# Patient Record
Sex: Male | Born: 1970 | Race: White | Hispanic: No | Marital: Married | Smoking: Never smoker
Health system: Southern US, Community
[De-identification: ages and names within clinical notes are randomized; demographics above are authoritative.]

---

## 2016-02-27 ENCOUNTER — Emergency Department: Payer: 59

## 2016-02-27 ENCOUNTER — Emergency Department
Admission: EM | Admit: 2016-02-27 | Discharge: 2016-02-27 | Disposition: A | Discharge status: Home / Self Care | Payer: 59 | Attending: Emergency Medicine | Admitting: Emergency Medicine

## 2016-02-27 DIAGNOSIS — N200 Calculus of kidney: Secondary | ICD-10-CM

## 2016-02-27 DIAGNOSIS — R109 Unspecified abdominal pain: Secondary | ICD-10-CM | POA: Diagnosis present

## 2016-02-27 LAB — COMPREHENSIVE METABOLIC PANEL
ALK PHOS: 55 U/L (ref 38–126)
ALT: 26 U/L (ref 17–63)
AST: 23 U/L (ref 15–41)
Albumin: 4.7 g/dL (ref 3.5–5.0)
Anion gap: 2 — ABNORMAL LOW (ref 5–15)
BUN: 18 mg/dL (ref 6–20)
CHLORIDE: 113 mmol/L — AB (ref 101–111)
CO2: 28 mmol/L (ref 22–32)
CREATININE: 0.98 mg/dL (ref 0.61–1.24)
Calcium: 8.7 mg/dL — ABNORMAL LOW (ref 8.9–10.3)
GFR calc Af Amer: >60 mL/min (ref >60)
Glucose, Bld: 111 mg/dL — ABNORMAL HIGH (ref 65–99)
Potassium: 3.7 mmol/L (ref 3.5–5.1)
Sodium: 143 mmol/L (ref 135–145)
Total Bilirubin: 0.3 mg/dL (ref 0.3–1.2)
Total Protein: 7 g/dL (ref 6.5–8.1)

## 2016-02-27 LAB — CBC
HEMATOCRIT: 40 % (ref 40.0–52.0)
HEMOGLOBIN: 13.9 g/dL (ref 13.0–18.0)
MCH: 30.1 pg (ref 26.0–34.0)
MCHC: 34.9 g/dL (ref 32.0–36.0)
MCV: 86.3 fL (ref 80.0–100.0)
PLATELETS: 277 10*3/uL (ref 150–440)
RBC: 4.63 MIL/uL (ref 4.40–5.90)
RDW: 13.4 % (ref 11.5–14.5)
WBC: 9.2 10*3/uL (ref 3.8–10.6)

## 2016-02-27 LAB — URINALYSIS COMPLETE WITH MICROSCOPIC (ARMC ONLY)
Bilirubin Urine: NEGATIVE
Glucose, UA: NEGATIVE mg/dL
Leukocytes, UA: NEGATIVE
Nitrite: NEGATIVE
PROTEIN: 100 mg/dL — AB
Specific Gravity, Urine: 1.028 (ref 1.005–1.030)
pH: 5 (ref 5.0–8.0)

## 2016-02-27 LAB — LIPASE, BLOOD: LIPASE: 19 U/L (ref 11–51)

## 2016-02-27 MED ORDER — TAMSULOSIN HCL 0.4 MG PO CAPS: 0.4000 mg | ORAL_CAPSULE | Freq: Every day | ORAL | Status: AC

## 2016-02-27 MED ORDER — KETOROLAC TROMETHAMINE 30 MG/ML IJ SOLN
30.0000 mg | Freq: Once | INTRAMUSCULAR | Status: AC
  Administered 2016-02-27: 30 mg via INTRAVENOUS
  Filled 2016-02-27: qty 1

## 2016-02-27 MED ORDER — HYDROCODONE-ACETAMINOPHEN 5-325 MG PO TABS: 1.0000 | ORAL_TABLET | ORAL | Status: AC | PRN

## 2016-02-27 MED ORDER — SODIUM CHLORIDE 0.9 % IV SOLN
1000.0000 mL | Freq: Once | INTRAVENOUS | Status: AC
  Administered 2016-02-27: 1000 mL via INTRAVENOUS

## 2016-02-27 MED ORDER — NAPROXEN 500 MG PO TABS: 500.0000 mg | ORAL_TABLET | Freq: Two times a day (BID) | ORAL | Status: AC

## 2016-02-27 MED ORDER — ONDANSETRON HCL 4 MG PO TABS: 4.0000 mg | ORAL_TABLET | Freq: Every day | ORAL | Status: AC | PRN

## 2016-02-27 NOTE — ED Notes (Signed)
Patient here for right sided flank pain.

## 2016-02-27 NOTE — ED Provider Notes (Signed)
[facility] Emergency Department Provider Note  ____________________________________________    I have reviewed the triage vital signs and the nursing notes.   HISTORY  Chief Complaint Flank Pain    HPI Terry HawthorneChristopher Bjorkman is a 45 y.o. male who presents with complaints of flank pain. Patient reports partially one half hours ago he developed acute right flank pain which was severe and sharp in nature. He reports the pain is improved somewhat but he feels significant pressure in his right lower flank now. He has never had this before. Several days ago he noticed dark urine. No history of kidney stones. No fevers or chills. No dysuria. No nausea.     No past medical history on file.  There are no active problems to display for this patient.   No past surgical history on file.  No current outpatient prescriptions on file.  Allergies Review of patient's allergies indicates not on file.  No family history on file.  Social History Social History  Substance Use Topics  . Smoking status: Not on file  . Smokeless tobacco: Not on file  . Alcohol Use: Not on file   nonsmoker, denies daily alcohol use  Review of Systems  Constitutional: Negative for fever. Eyes: Negative for redness ENT: Negative for sore throat Cardiovascular: Negative for chest pain Respiratory: Negative for shortness of breath. Gastrointestinal: Flank pain as above  Genitourinary: Negative for dysuria.hematuria as above Musculoskeletal: Negative for back pain. Skin: Negative for rash. Neurological: Negative for headache Psychiatric: no anxiety    ____________________________________________   PHYSICAL EXAM:  VITAL SIGNS: ED Triage Vitals  Enc Vitals Group     BP 02/27/16 1731 157/99 mmHg     Pulse Rate 02/27/16 1731 82     Resp 02/27/16 1731 20     Temp 02/27/16 1731 98.1 F (36.7 C)     Temp Source 02/27/16 1731 Oral     SpO2 02/27/16 1731 99 %     Weight 02/27/16  1731 250 lb (113.399 kg)     Height 02/27/16 1731 6\' 4"  (1.93 m)     Head Cir --      Peak Flow --      Pain Score --      Pain Loc --      Pain Edu? --      Excl. in GC? --      Constitutional: Alert and oriented. Well appearing and in no distress. Pleasant and interactive Eyes: Conjunctivae are normal. No erythema or injection ENT   Head: Normocephalic and atraumatic.   Mouth/Throat: Mucous membranes are moist. Cardiovascular: Normal rate, regular rhythm. Normal and symmetric distal pulses are present in the upper extremities.  Respiratory: Normal respiratory effort without tachypnea nor retractions.  Gastrointestinal: Soft and non-tender in all quadrants. No distention. There is no CVA tenderness. Genitourinary: deferred Musculoskeletal: Nontender with normal range of motion in all extremities.  Neurologic:  Normal speech and language. No gross focal neurologic deficits are appreciated. Skin:  Skin is warm, dry and intact. No rash noted. Psychiatric: Mood and affect are normal. Patient exhibits appropriate insight and judgment.  ____________________________________________    LABS (pertinent positives/negatives)  Labs Reviewed  CBC  COMPREHENSIVE METABOLIC PANEL  URINALYSIS COMPLETEWITH MICROSCOPIC ([facility] ONLY)  LIPASE, BLOOD    ____________________________________________   EKG  None  ____________________________________________    RADIOLOGY  CT shows a small 3 mm right stone  ____________________________________________   PROCEDURES  Procedure(s) performed: none  Critical Care performed: none  ____________________________________________  INITIAL IMPRESSION / ASSESSMENT AND PLAN / ED COURSE  Pertinent labs & imaging results that were available during my care of the patient were reviewed by me and considered in my medical decision making (see chart for details).  Patient present with acute onset right flank pain, likely hematuria several  days ago. This is most consistent with kidney stone. We will give Toradol 30 mg IM obtain CT renal stone study, urinalysis and blood work and reevaluate.  Patient had complete resolution of pain after Toradol. CT shows 3 mm stone. High likelihood of passing. No evidence of infection and blood work or urine. I'll discharge the patient with pain medications analgesics and urology follow-up. We discussed return precautions  ____________________________________________   FINAL CLINICAL IMPRESSION(S) / ED DIAGNOSES  Final diagnoses:  Kidney stone          Jene Every, MD 02/27/16 1610

## 2016-02-27 NOTE — Discharge Instructions (Signed)
Kidney Stones °Kidney stones (urolithiasis) are deposits that form inside your kidneys. The intense pain is caused by the stone moving through the urinary tract. When the stone moves, the ureter goes into spasm around the stone. The stone is usually passed in the urine.  °CAUSES  °· A disorder that makes certain neck glands produce too much parathyroid hormone (primary hyperparathyroidism). °· A buildup of uric acid crystals, similar to gout in your joints. °· Narrowing (stricture) of the ureter. °· A kidney obstruction present at birth (congenital obstruction). °· Previous surgery on the kidney or ureters. °· Numerous kidney infections. °SYMPTOMS  °· Feeling sick to your stomach (nauseous). °· Throwing up (vomiting). °· Blood in the urine (hematuria). °· Pain that usually spreads (radiates) to the groin. °· Frequency or urgency of urination. °DIAGNOSIS  °· Taking a history and physical exam. °· Blood or urine tests. °· CT scan. °· Occasionally, an examination of the inside of the urinary bladder (cystoscopy) is performed. °TREATMENT  °· Observation. °· Increasing your fluid intake. °· Extracorporeal shock wave lithotripsy--This is a noninvasive procedure that uses shock waves to break up kidney stones. °· Surgery may be needed if you have severe pain or persistent obstruction. There are various surgical procedures. Most of the procedures are performed with the use of small instruments. Only small incisions are needed to accommodate these instruments, so recovery time is minimized. °The size, location, and chemical composition are all important variables that will determine the proper choice of action for you. Talk to your health care provider to better understand your situation so that you will minimize the risk of injury to yourself and your kidney.  °HOME CARE INSTRUCTIONS  °· Drink enough water and fluids to keep your urine clear or pale yellow. This will help you to pass the stone or stone fragments. °· Strain  all urine through the provided strainer. Keep all particulate matter and stones for your health care provider to see. The stone causing the pain may be as small as a grain of salt. It is very important to use the strainer each and every time you pass your urine. The collection of your stone will allow your health care provider to analyze it and verify that a stone has actually passed. The stone analysis will often identify what you can do to reduce the incidence of recurrences. °· Only take over-the-counter or prescription medicines for pain, discomfort, or fever as directed by your health care provider. °· Keep all follow-up visits as told by your health care provider. This is important. °· Get follow-up X-rays if required. The absence of pain does not always mean that the stone has passed. It may have only stopped moving. If the urine remains completely obstructed, it can cause loss of kidney function or even complete destruction of the kidney. It is your responsibility to make sure X-rays and follow-ups are completed. Ultrasounds of the kidney can show blockages and the status of the kidney. Ultrasounds are not associated with any radiation and can be performed easily in a matter of minutes. °· Make changes to your daily diet as told by your health care provider. You may be told to: °¨ Limit the amount of salt that you eat. °¨ Eat 5 or more servings of fruits and vegetables each day. °¨ Limit the amount of meat, poultry, fish, and eggs that you eat. °· Collect a 24-hour urine sample as told by your health care provider. You may need to collect another urine sample every 6-12   months. °SEEK MEDICAL CARE IF: °· You experience pain that is progressive and unresponsive to any pain medicine you have been prescribed. °SEEK IMMEDIATE MEDICAL CARE IF:  °· Pain cannot be controlled with the prescribed medicine. °· You have a fever or shaking chills. °· The severity or intensity of pain increases over 18 hours and is not  relieved by pain medicine. °· You develop a new onset of abdominal pain. °· You feel faint or pass out. °· You are unable to urinate. °  °This information is not intended to replace advice given to you by your health care provider. Make sure you discuss any questions you have with your health care provider. °  °Document Released: 11/22/2005 Document Revised: 08/13/2015 Document Reviewed: 04/25/2013 °[employer]vier Interactive Patient Education ©2016 [employer] ° °

## 2017-08-23 IMAGING — CT CT RENAL STONE PROTOCOL
1 of 2 series · 15 of 32 positions shown, 19 images · non-contrast
Comparison: None.

CLINICAL DATA: Acute right-sided flank pain.

EXAM:
CT ABDOMEN AND PELVIS WITHOUT CONTRAST
TECHNIQUE: Multidetector CT imaging of the abdomen and pelvis was performed
following the standard protocol without IV contrast.

[Series 2: stone standard full · axial · 0.81mm/px · z∈[-1201,-736]mm · 15 of 103 slices shown, 19 images]
[im 5/103  soft-tissue]
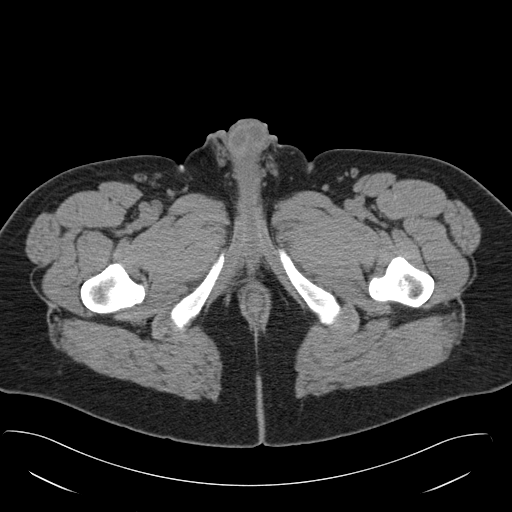
[im 5/103  bone]
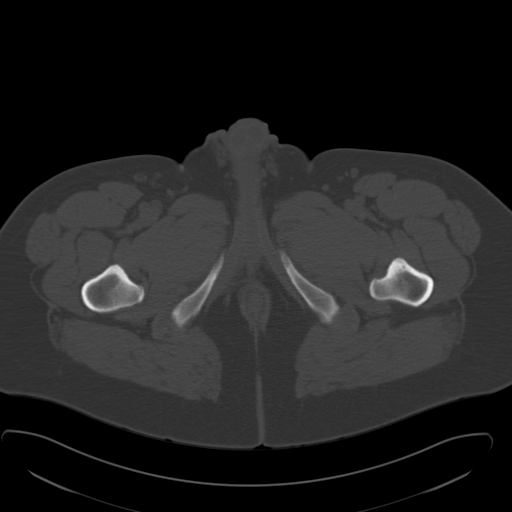
[im 13/103  soft-tissue]
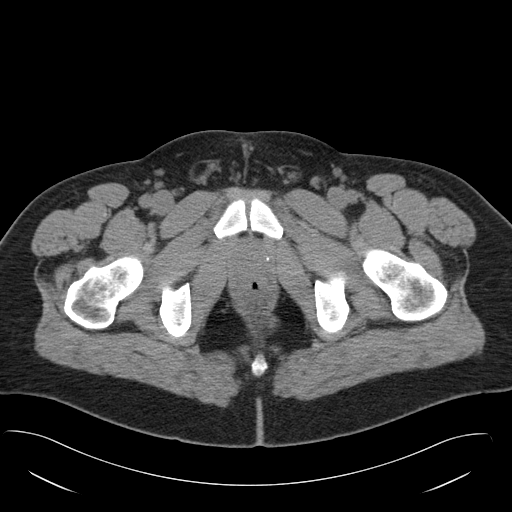
[im 22/103  soft-tissue]
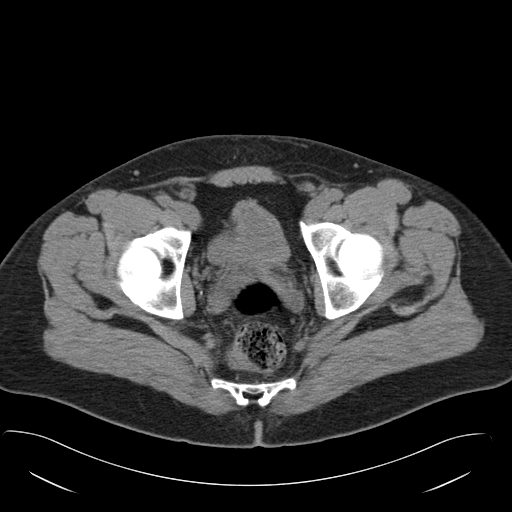
[im 30/103  soft-tissue]
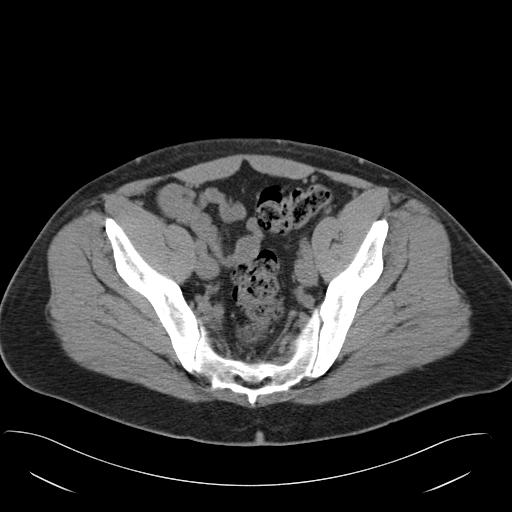
[im 35/103  soft-tissue]
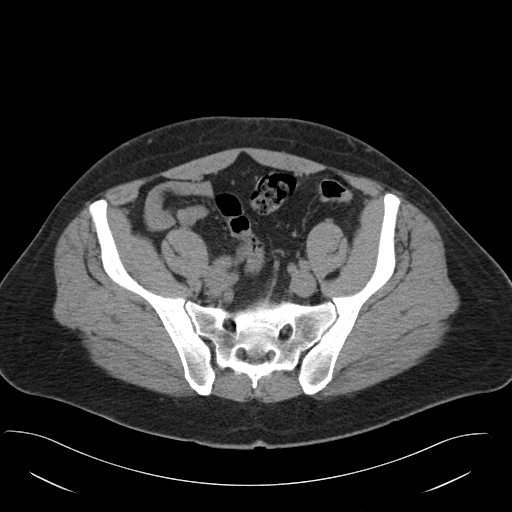
[im 43/103  soft-tissue]
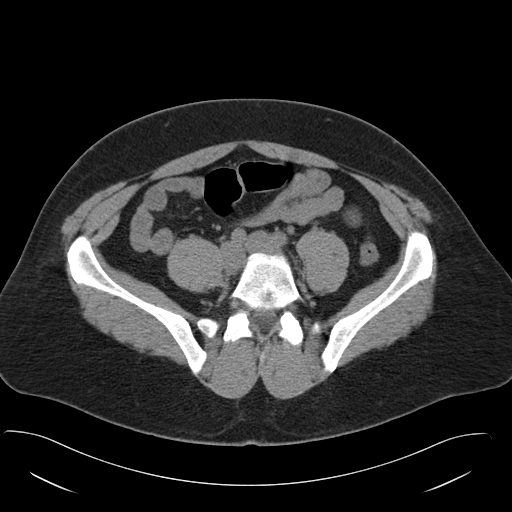
[im 52/103  soft-tissue]
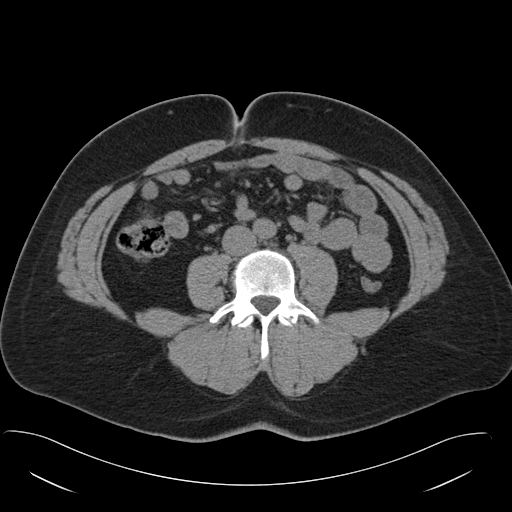
[im 60/103  soft-tissue]
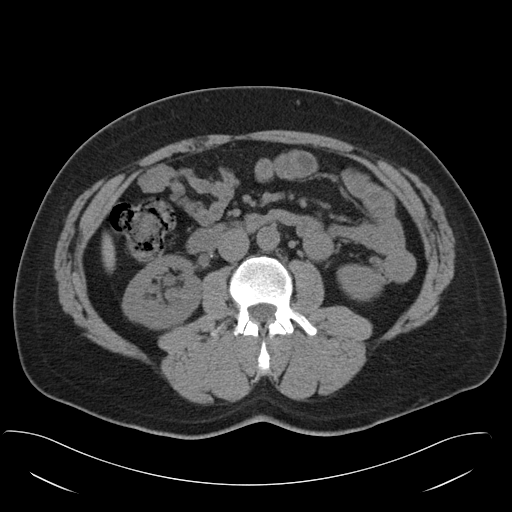
[im 69/103  soft-tissue]
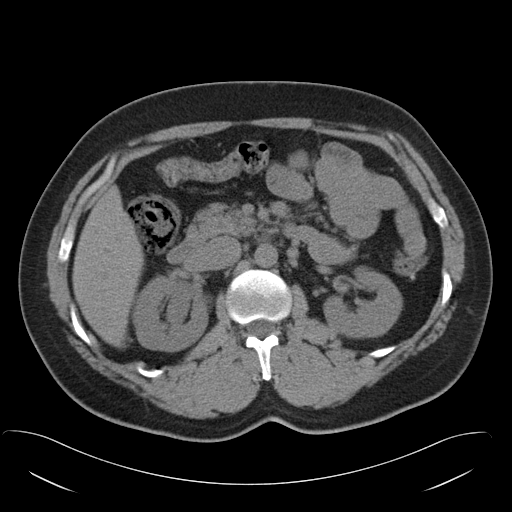
[im 69/103  bone]
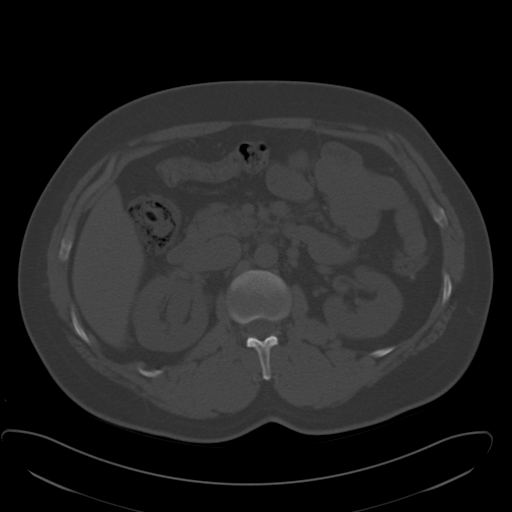
[im 73/103  soft-tissue]
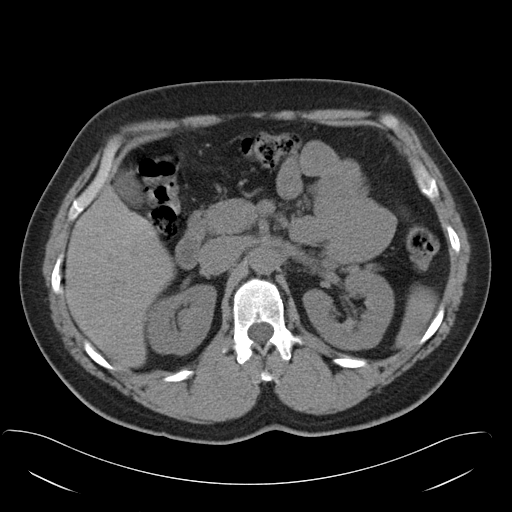
[im 81/103  soft-tissue]
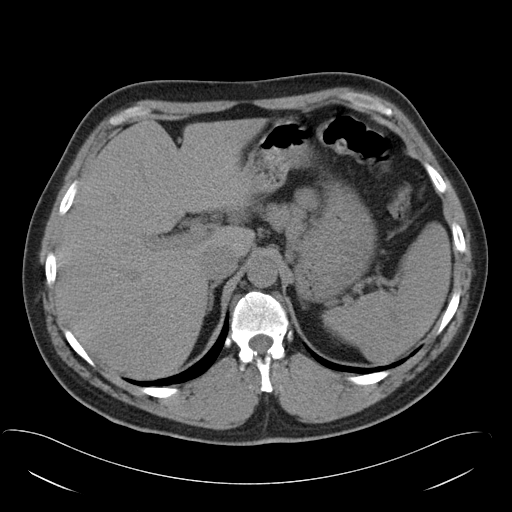
[im 86/103  lung]
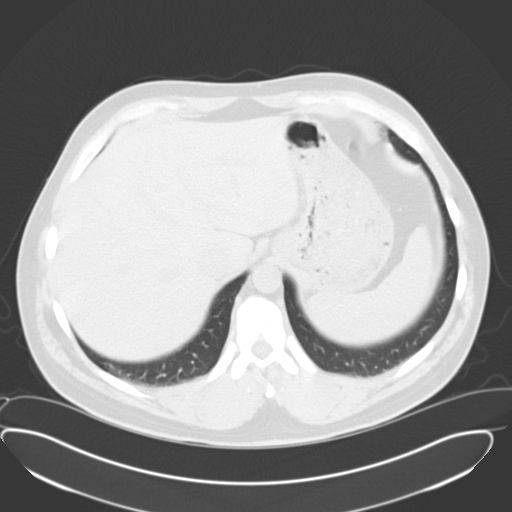
[im 90/103  soft-tissue]
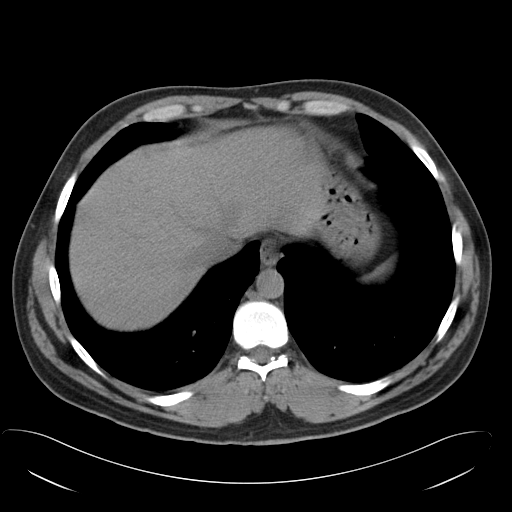
[im 90/103  lung]
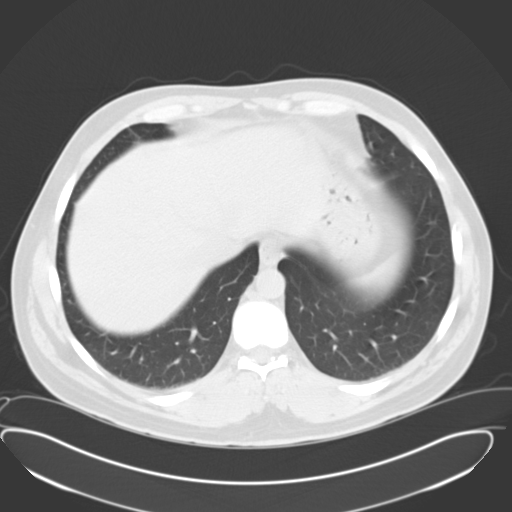
[im 94/103  lung]
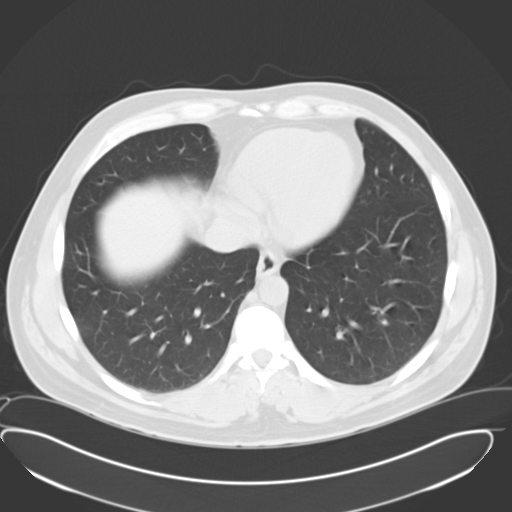
[im 98/103  soft-tissue]
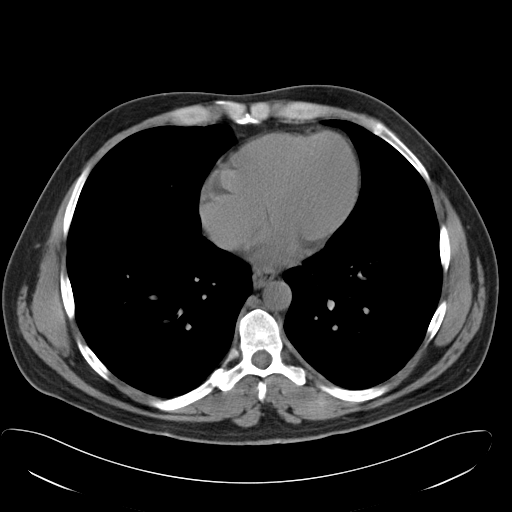
[im 98/103  lung]
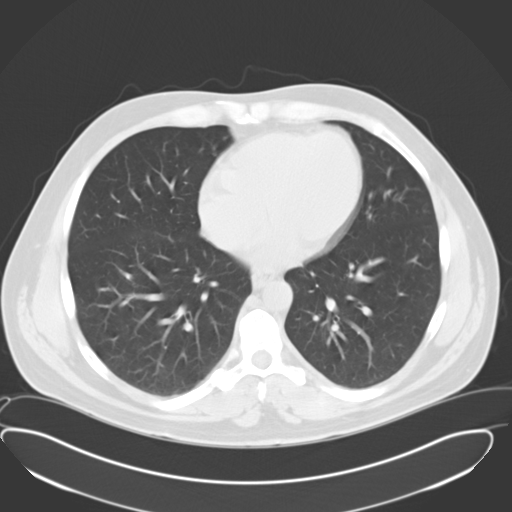

[15 of 32 positions shown; findings below may reference images not displayed]

FINDINGS: Minimal dependent change is present in the lung bases. There is no
pleural effusion.

The liver, gallbladder, spleen, adrenal glands, and pancreas have an
unremarkable unenhanced appearance. There is an obstructing 3 mm
stone in the proximal right ureter resulting in mild
hydroureteronephrosis. The ureter distal to this is decompressed. No
left renal calculi or hydronephrosis is seen. The left ureter is
decompressed.

There is no evidence of bowel obstruction. The appendix is
unremarkable. Bladder is decompressed. The prostate is borderline
enlarged.

The aorta is normal in caliber, and there is minimal atherosclerotic
vascular calcification. There are small fat containing inguinal
hernias. No free fluid or enlarged lymph nodes are identified. No
acute osseous abnormality is identified.
IMPRESSION: Obstructing 3 mm proximal right ureteral stone with mild
hydronephrosis.
